# Patient Record
Sex: Male | Born: 1965 | Race: White | Hispanic: No | Marital: Married | Smoking: Former smoker
Health system: Southern US, Community
[De-identification: ages and names within clinical notes are randomized; demographics above are authoritative.]

## PROBLEM LIST (undated history)

## (undated) DIAGNOSIS — E119 Type 2 diabetes mellitus without complications: Secondary | ICD-10-CM

## (undated) DIAGNOSIS — O2230 Deep phlebothrombosis in pregnancy, unspecified trimester: Secondary | ICD-10-CM

## (undated) DIAGNOSIS — I10 Essential (primary) hypertension: Secondary | ICD-10-CM

## (undated) DIAGNOSIS — M549 Dorsalgia, unspecified: Secondary | ICD-10-CM

## (undated) DIAGNOSIS — E7800 Pure hypercholesterolemia, unspecified: Secondary | ICD-10-CM

## (undated) DIAGNOSIS — N289 Disorder of kidney and ureter, unspecified: Secondary | ICD-10-CM

## (undated) HISTORY — PX: EYE SURGERY: SHX253

## (undated) HISTORY — PX: BACK SURGERY: SHX140

## (undated) HISTORY — PX: KNEE ARTHROSCOPY: SHX127

## (undated) HISTORY — PX: ANAL FISSURECTOMY: SUR608

## (undated) HISTORY — PX: SPINAL CORD STIMULATOR IMPLANT: SHX2422

---

## 2002-06-05 ENCOUNTER — Ambulatory Visit (HOSPITAL_COMMUNITY): Admission: RE | Admit: 2002-06-05 | Discharge: 2002-06-05 | Payer: Self-pay | Admitting: Gastroenterology

## 2005-03-11 ENCOUNTER — Ambulatory Visit (HOSPITAL_COMMUNITY): Admission: RE | Admit: 2005-03-11 | Discharge: 2005-03-11 | Payer: Self-pay | Admitting: Neurological Surgery

## 2019-01-17 ENCOUNTER — Other Ambulatory Visit: Payer: Self-pay | Admitting: Nephrology

## 2019-01-17 DIAGNOSIS — N183 Chronic kidney disease, stage 3 unspecified: Secondary | ICD-10-CM

## 2019-01-25 ENCOUNTER — Other Ambulatory Visit: Payer: Self-pay

## 2019-01-25 ENCOUNTER — Ambulatory Visit
Admission: RE | Admit: 2019-01-25 | Discharge: 2019-01-25 | Disposition: A | Discharge status: Home / Self Care | Payer: 59 | Source: Ambulatory Visit | Attending: Nephrology | Admitting: Nephrology

## 2019-01-25 DIAGNOSIS — N183 Chronic kidney disease, stage 3 unspecified: Secondary | ICD-10-CM

## 2019-02-11 ENCOUNTER — Other Ambulatory Visit: Payer: Self-pay | Admitting: Nephrology

## 2019-02-11 DIAGNOSIS — N2889 Other specified disorders of kidney and ureter: Secondary | ICD-10-CM

## 2019-02-11 DIAGNOSIS — N183 Chronic kidney disease, stage 3 unspecified: Secondary | ICD-10-CM

## 2019-02-18 ENCOUNTER — Other Ambulatory Visit: Payer: 59

## 2019-03-05 ENCOUNTER — Ambulatory Visit
Admission: RE | Admit: 2019-03-05 | Discharge: 2019-03-05 | Disposition: A | Discharge status: Home / Self Care | Payer: 59 | Source: Ambulatory Visit | Attending: Nephrology | Admitting: Nephrology

## 2019-03-05 DIAGNOSIS — N2889 Other specified disorders of kidney and ureter: Secondary | ICD-10-CM

## 2019-03-05 MED ORDER — IOPAMIDOL (ISOVUE-300) INJECTION 61%
100.0000 mL | Freq: Once | INTRAVENOUS | Status: AC | PRN
  Administered 2019-03-05: 100 mL via INTRAVENOUS

## 2019-10-09 ENCOUNTER — Encounter (HOSPITAL_BASED_OUTPATIENT_CLINIC_OR_DEPARTMENT_OTHER): Payer: Self-pay | Admitting: Emergency Medicine

## 2019-10-09 ENCOUNTER — Emergency Department (HOSPITAL_BASED_OUTPATIENT_CLINIC_OR_DEPARTMENT_OTHER): Payer: 59

## 2019-10-09 ENCOUNTER — Emergency Department (HOSPITAL_BASED_OUTPATIENT_CLINIC_OR_DEPARTMENT_OTHER)
Admission: EM | Admit: 2019-10-09 | Discharge: 2019-10-09 | Disposition: A | Discharge status: Home / Self Care | Payer: 59 | Attending: Emergency Medicine | Admitting: Emergency Medicine

## 2019-10-09 ENCOUNTER — Other Ambulatory Visit: Payer: Self-pay

## 2019-10-09 DIAGNOSIS — I10 Essential (primary) hypertension: Secondary | ICD-10-CM | POA: Diagnosis not present

## 2019-10-09 DIAGNOSIS — L03317 Cellulitis of buttock: Secondary | ICD-10-CM | POA: Diagnosis not present

## 2019-10-09 DIAGNOSIS — Z79899 Other long term (current) drug therapy: Secondary | ICD-10-CM | POA: Diagnosis not present

## 2019-10-09 DIAGNOSIS — E119 Type 2 diabetes mellitus without complications: Secondary | ICD-10-CM | POA: Insufficient documentation

## 2019-10-09 DIAGNOSIS — Z87891 Personal history of nicotine dependence: Secondary | ICD-10-CM | POA: Insufficient documentation

## 2019-10-09 DIAGNOSIS — R739 Hyperglycemia, unspecified: Secondary | ICD-10-CM

## 2019-10-09 DIAGNOSIS — K6289 Other specified diseases of anus and rectum: Secondary | ICD-10-CM | POA: Diagnosis present

## 2019-10-09 HISTORY — DX: Pure hypercholesterolemia, unspecified: E78.00

## 2019-10-09 HISTORY — DX: Essential (primary) hypertension: I10

## 2019-10-09 HISTORY — DX: Type 2 diabetes mellitus without complications: E11.9

## 2019-10-09 HISTORY — DX: Disorder of kidney and ureter, unspecified: N28.9

## 2019-10-09 HISTORY — DX: Dorsalgia, unspecified: M54.9

## 2019-10-09 HISTORY — DX: Deep phlebothrombosis in pregnancy, unspecified trimester: O22.30

## 2019-10-09 LAB — CBC WITH DIFFERENTIAL/PLATELET
Abs Immature Granulocytes: 0.06 10*3/uL (ref 0.00–0.07)
Basophils Absolute: 0 10*3/uL (ref 0.0–0.1)
Basophils Relative: 0 %
Eosinophils Absolute: 0.2 10*3/uL (ref 0.0–0.5)
Eosinophils Relative: 2 %
HCT: 39.8 % (ref 39.0–52.0)
Hemoglobin: 13.8 g/dL (ref 13.0–17.0)
Immature Granulocytes: 1 %
Lymphocytes Relative: 10 %
Lymphs Abs: 1 10*3/uL (ref 0.7–4.0)
MCH: 31.2 pg (ref 26.0–34.0)
MCHC: 34.7 g/dL (ref 30.0–36.0)
MCV: 90 fL (ref 80.0–100.0)
Monocytes Absolute: 0.7 10*3/uL (ref 0.1–1.0)
Monocytes Relative: 7 %
Neutro Abs: 7.5 10*3/uL (ref 1.7–7.7)
Neutrophils Relative %: 80 %
Platelets: 142 10*3/uL — ABNORMAL LOW (ref 150–400)
RBC: 4.42 MIL/uL (ref 4.22–5.81)
RDW: 12.1 % (ref 11.5–15.5)
WBC: 9.4 10*3/uL (ref 4.0–10.5)
nRBC: 0 % (ref 0.0–0.2)

## 2019-10-09 LAB — COMPREHENSIVE METABOLIC PANEL
ALT: 34 U/L (ref 0–44)
AST: 20 U/L (ref 15–41)
Albumin: 3.7 g/dL (ref 3.5–5.0)
Alkaline Phosphatase: 106 U/L (ref 38–126)
Anion gap: 11 (ref 5–15)
BUN: 24 mg/dL — ABNORMAL HIGH (ref 6–20)
CO2: 25 mmol/L (ref 22–32)
Calcium: 8.7 mg/dL — ABNORMAL LOW (ref 8.9–10.3)
Chloride: 94 mmol/L — ABNORMAL LOW (ref 98–111)
Creatinine, Ser: 1.6 mg/dL — ABNORMAL HIGH (ref 0.61–1.24)
GFR calc Af Amer: 56 mL/min — ABNORMAL LOW (ref >60)
GFR calc non Af Amer: 48 mL/min — ABNORMAL LOW (ref >60)
Glucose, Bld: 487 mg/dL — ABNORMAL HIGH (ref 70–99)
Potassium: 4.1 mmol/L (ref 3.5–5.1)
Sodium: 130 mmol/L — ABNORMAL LOW (ref 135–145)
Total Bilirubin: 0.9 mg/dL (ref 0.3–1.2)
Total Protein: 7.2 g/dL (ref 6.5–8.1)

## 2019-10-09 LAB — CBG MONITORING, ED: Glucose-Capillary: 387 mg/dL — ABNORMAL HIGH (ref 70–99)

## 2019-10-09 LAB — LACTIC ACID, PLASMA: Lactic Acid, Venous: 1.3 mmol/L (ref 0.5–1.9)

## 2019-10-09 MED ORDER — MORPHINE SULFATE (PF) 4 MG/ML IV SOLN
4.0000 mg | Freq: Once | INTRAVENOUS | Status: AC
  Administered 2019-10-09: 11:00:00 4 mg via INTRAVENOUS
  Filled 2019-10-09: qty 1

## 2019-10-09 MED ORDER — IOHEXOL 300 MG/ML  SOLN
100.0000 mL | Freq: Once | INTRAMUSCULAR | Status: AC | PRN
  Administered 2019-10-09: 11:00:00 100 mL via INTRAVENOUS

## 2019-10-09 MED ORDER — ACETAMINOPHEN 325 MG PO TABS
650.0000 mg | ORAL_TABLET | Freq: Once | ORAL | Status: AC
  Administered 2019-10-09: 11:00:00 650 mg via ORAL
  Filled 2019-10-09: qty 2

## 2019-10-09 MED ORDER — METFORMIN HCL 500 MG PO TABS
ORAL_TABLET | ORAL | Status: AC
  Filled 2019-10-09: qty 1

## 2019-10-09 MED ORDER — METFORMIN HCL 850 MG PO TABS
850.0000 mg | ORAL_TABLET | Freq: Once | ORAL | Status: DC
  Filled 2019-10-09: qty 1

## 2019-10-09 MED ORDER — INSULIN ASPART 100 UNIT/ML ~~LOC~~ SOLN
5.0000 [IU] | Freq: Once | SUBCUTANEOUS | Status: AC
  Administered 2019-10-09: 12:00:00 5 [IU] via SUBCUTANEOUS
  Filled 2019-10-09: qty 1

## 2019-10-09 MED ORDER — AMOXICILLIN-POT CLAVULANATE 875-125 MG PO TABS: 1.0000 | ORAL_TABLET | Freq: Two times a day (BID) | ORAL | 0 refills | Status: DC

## 2019-10-09 MED ORDER — PIPERACILLIN-TAZOBACTAM 3.375 G IVPB 30 MIN
3.3750 g | Freq: Once | INTRAVENOUS | Status: AC
  Administered 2019-10-09: 11:00:00 3.375 g via INTRAVENOUS
  Filled 2019-10-09 (×2): qty 50

## 2019-10-09 MED ORDER — SODIUM CHLORIDE 0.9 % IV SOLN: INTRAVENOUS | Status: DC | PRN

## 2019-10-09 MED ORDER — METFORMIN HCL 500 MG PO TABS: 500.0000 mg | ORAL_TABLET | Freq: Once | ORAL | Status: DC

## 2019-10-09 MED ORDER — METFORMIN HCL 850 MG PO TABS: 850.0000 mg | ORAL_TABLET | Freq: Two times a day (BID) | ORAL | 0 refills | Status: DC

## 2019-10-09 MED ORDER — SODIUM CHLORIDE 0.9 % IV BOLUS
1000.0000 mL | Freq: Once | INTRAVENOUS | Status: AC
  Administered 2019-10-09: 11:00:00 1000 mL via INTRAVENOUS

## 2019-10-09 MED FILL — MetFORMIN HCL 850 MG TAB: 850 | 30 days supply | Qty: 60 | Fill #0

## 2019-10-09 MED FILL — AMOX-CLAV 875-125 MG TABLET: 875-125 | 7 days supply | Qty: 14 | Fill #0

## 2019-10-09 NOTE — ED Notes (Signed)
ED Provider at bedside. 

## 2019-10-09 NOTE — Discharge Instructions (Signed)
Take augmentin twice daily for a week.   Take metformin twice daily as prescribed.   You likely have diabetes. You need to see your doctor and likely will need to be restarted on insulin   Take your other meds as prescribed   Return to ER if you have fever, worse pain, vomiting.

## 2019-10-09 NOTE — ED Notes (Signed)
Patient transported to CT 

## 2019-10-09 NOTE — ED Triage Notes (Signed)
Hard, swollen area between testicles and anus that started 3 days ago.  Pt sts it has "doubled in size since yesterday."

## 2019-10-09 NOTE — ED Provider Notes (Signed)
[facility] EMERGENCY DEPARTMENT Provider Note   CSN: [id number] Arrival date & time: 10/09/19  3151     History Chief Complaint  Patient presents with  . Abscess    Donald Lin is a 54 y.o. male hx of HTN, previous anal fissure, here presenting with rectal pain .  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  Patient states that he noticed rectal pain for the last 3 to 4 days .  Patient states that the area became more more swollen and he has significant pain there.  He tried some stool softeners with no relief .  Patient denies any anal sex .  Patient is only sexually active with his wife.  Patient denies any previous history of MRSA infections.   The history is provided by the patient.       Past Medical History:  Diagnosis Date  . Back pain   . Diabetes mellitus without complication (HCC)   . DVT (deep vein thrombosis) in pregnancy   . High cholesterol   . Hypertension   . Renal disorder     There are no problems to display for this patient.   Past Surgical History:  Procedure Laterality Date  . ANAL FISSURECTOMY    . BACK SURGERY    . EYE SURGERY    . KNEE ARTHROSCOPY    . SPINAL CORD STIMULATOR IMPLANT         No family history on file.  Social History   Tobacco Use  . Smoking status: Former Games developer  . Smokeless tobacco: Never Used  Substance Use Topics  . Alcohol use: Yes    Comment: rarely  . Drug use: Never    Home Medications Prior to Admission medications   Medication Sig Start Date End Date Taking? Authorizing Provider  methadone (DOLOPHINE) 10 MG tablet Take 10 mg by mouth 3 (three) times daily. 10/07/19   [provider]  Oxycodone HCl 10 MG TABS Take 10 mg by mouth 4 (four) times daily. 10/07/19   [provider]    Allergies    Patient has no known allergies.  Review of Systems   Review of Systems  Gastrointestinal: Positive for rectal pain.  All other systems reviewed and are  negative.   Physical Exam Updated Vital Signs BP (!) 141/83 (BP Location: Left Arm)   Pulse 88   Temp 98.9 F (37.2 C) (Oral)   Resp 16   Ht 6\' 5"  (1.956 m)   Wt (!) 147 kg   SpO2 95%   BMI 38.42 kg/m   Physical Exam Vitals and nursing note reviewed.  HENT:     Head: Normocephalic.     Nose: Nose normal.     Mouth/Throat:     Mouth: Mucous membranes are moist.  Eyes:     Pupils: Pupils are equal, round, and reactive to light.  Cardiovascular:     Rate and Rhythm: Normal rate.     Pulses: Normal pulses.  Pulmonary:     Effort: Pulmonary effort is normal.  Abdominal:     General: Abdomen is flat.  Genitourinary:    Comments: Rectal- ? Condylomas vs skin tags. There is diffuse swelling L perirectal area. ? Cellulitis vs abscess No scrotal swelling or tenderness. No obvious anal fissures  Musculoskeletal:        General: Normal range of motion.     Cervical back: Normal range of motion.  Skin:    General: Skin is warm.  Capillary Refill: Capillary refill takes less than 2 seconds.  Neurological:     General: No focal deficit present.     Mental Status: He is alert.  Psychiatric:        Mood and Affect: Mood normal.     ED Results / Procedures / Treatments   Labs (all labs ordered are listed, but only abnormal results are displayed) Labs Reviewed  CBC WITH DIFFERENTIAL/PLATELET - Abnormal; Notable for the following components:      Result Value   Platelets 142 (*)    All other components within normal limits  COMPREHENSIVE METABOLIC PANEL - Abnormal; Notable for the following components:   Sodium 130 (*)    Chloride 94 (*)    Glucose, Bld 487 (*)    BUN 24 (*)    Creatinine, Ser 1.60 (*)    Calcium 8.7 (*)    GFR calc non Af Amer 48 (*)    GFR calc Af Amer 56 (*)    All other components within normal limits  CBG MONITORING, ED - Abnormal; Notable for the following components:   Glucose-Capillary 387 (*)    All other components within normal limits    CULTURE, BLOOD (ROUTINE X 2)  CULTURE, BLOOD (ROUTINE X 2)  LACTIC ACID, PLASMA  LACTIC ACID, PLASMA    EKG None  Radiology CT PELVIS W CONTRAST  Result Date: 10/09/2019 CLINICAL DATA:  Lymphadenopathy, inguinal/pelvic rectal abscess versus cellulitis. Scrotal boil. EXAM: CT PELVIS WITH CONTRAST TECHNIQUE: Multidetector CT imaging of the pelvis was performed using the standard protocol following the bolus administration of intravenous contrast. CONTRAST:  OMNIPAQUE IOHEXOL 300 MG/ML  SOLN COMPARISON:  CT abdomen 03/05/2019. FINDINGS: Urinary Tract: Distal ureters are decompressed. Bladder is grossly unremarkable. Bowel: Visualized portions of the small bowel and colon are unremarkable. Vascular/Lymphatic: Vascular structures are unremarkable. There are small lymph nodes along the iliac chains bilaterally, measuring up to 10 mm along the external iliac chains. Inguinal lymph nodes measure up to 10 mm on the right. Reproductive:  Prostate is visualized. Other: There is scrotal thickening bilaterally. The right testicle appears slightly higher than the left. No discrete fluid collection. Phlegmon and stranding extend along the perineum, extending posteriorly to the gluteal folds. There is asymmetric subcutaneous soft tissue thickening along the medial aspect of the left gluteal fold, again without a discrete fluid collection. Bilateral inguinal hernias contain fat. No peritoneal free fluid. Musculoskeletal: Degenerative and postoperative changes in the spine. Sclerotic lesion in the proximal right femur may represent an enchondroma or old bone infarct. There is evidence of avascular necrosis in the left femoral head. Otherwise, no worrisome lytic or sclerotic lesions. IMPRESSION: 1. Cellulitis involving the scrotum, perineum and gluteal folds. No definite organized fluid collection to suggest abscess. Note is made that CT is rather nonspecific in the evaluation of the testicles and scrotal sac.  Further evaluation with testicular ultrasound may be helpful in further evaluation as clinically indicated. 2. Small lymph nodes along the iliac chains and inguinal regions, likely reactive. 3. Bilateral inguinal hernias contain fat. 4. 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 Electronically Signed   By: Leanna Battles M.D.   On: 10/09/2019 12:07    Procedures Procedures (including critical care time)  Medications Ordered in ED Medications  0.9 %  sodium chloride infusion ( Intravenous New Bag/Given 10/09/19 1057)  sodium chloride 0.9 % bolus 1,000 mL (1,000 mLs Intravenous New Bag/Given 10/09/19 1036)  morphine 4 MG/ML injection 4 mg (4 mg Intravenous  Given 10/09/19 1054)  piperacillin-tazobactam (ZOSYN) IVPB 3.375 g (3.375 g Intravenous New Bag/Given 10/09/19 1059)  acetaminophen (TYLENOL) tablet 650 mg (650 mg Oral Given 10/09/19 1052)  iohexol (OMNIPAQUE) 300 MG/ML solution 100 mL (100 mLs Intravenous Contrast Given 10/09/19 1117)  insulin aspart (novoLOG) injection 5 Units (5 Units Subcutaneous Given 10/09/19 1137)    ED Course  I have reviewed the triage vital signs and the nursing notes.  Pertinent labs & imaging results that were available during my care of the patient were reviewed by me and considered in my medical decision making (see chart for details).    MDM Rules/Calculators/A&P                      Donald Lin is a 54 y.o. male here with left buttock swelling.  Patient has low-grade temp and is tachycardic .  I am concerned for possible fistula versus abscess versus cellulitis.  Will get sepsis work-up and perform CT pelvis for further evaluation .  Will give antibiotics empirically.  12:34 PM Patient's glucose is 487.  His anion gap is normal.  His sodium is slightly low likely from hyperglycemia.  His CT shows cellulitis with no obvious abscess .  I discussed with patient regarding diagnosis of diabetes .  He states that he was actually on insulin and Metformin previously.  However he changed primary care doctors and he is out of those medicines for a while.  I told him that I can prescribe Metformin and Augmentin for now.  He needs to see his primary care doctor and likely will need to be restarted on insulin.      Final Clinical Impression(s) / ED Diagnoses Final diagnoses:  None    Rx / DC Orders ED Discharge Orders    None       Drenda Freeze, MD 10/09/19 1235

## 2019-10-14 LAB — CULTURE, BLOOD (ROUTINE X 2)
Culture: NO GROWTH
Culture: NO GROWTH
Special Requests: ADEQUATE
Special Requests: ADEQUATE

## 2020-09-28 IMAGING — CT CT PELVIS W/ CM
2 of 4 series · 15 of 46 positions shown, 18 images · IV contrast (Omnipaque)
Comparison: CT abdomen 03/05/2019.

CLINICAL DATA: Lymphadenopathy, inguinal/pelvic rectal abscess
versus cellulitis. Scrotal boil.

EXAM:
CT PELVIS WITH CONTRAST
TECHNIQUE: Multidetector CT imaging of the pelvis was performed using the
standard protocol following the bolus administration of intravenous
contrast.
CONTRAST:  100mL OMNIPAQUE IOHEXOL 300 MG/ML  SOLN

[Series 3: axial soft tissue · axial · 0.93mm/px · z∈[-547,-171]mm · 12 of 209 slices shown, 15 images]
[im 14/209  soft-tissue]
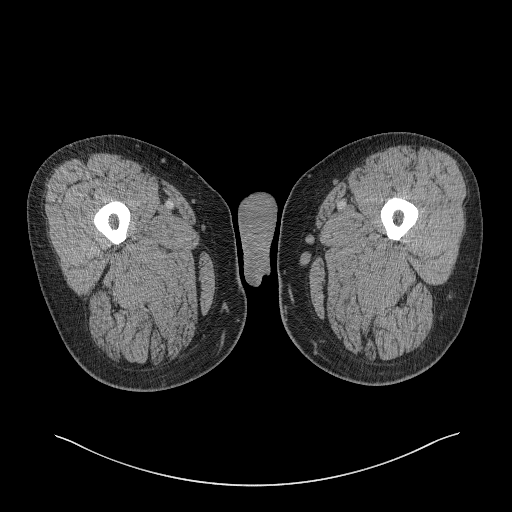
[im 14/209  bone]
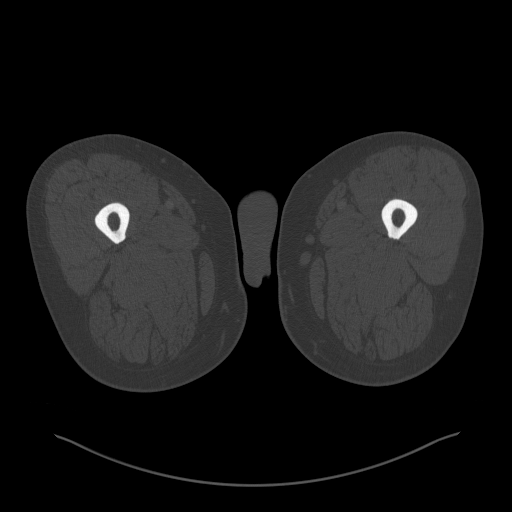
[im 41/209  soft-tissue]
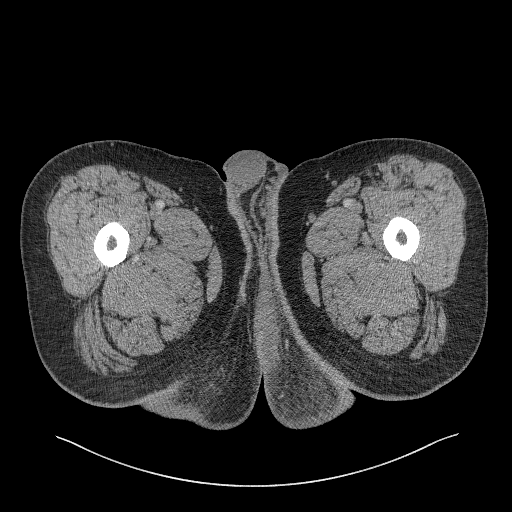
[im 61/209  soft-tissue]
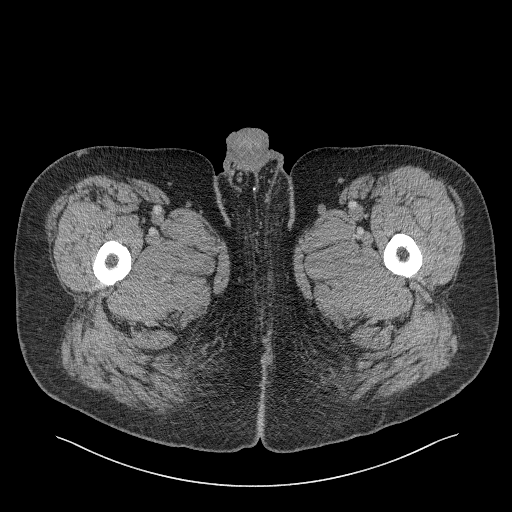
[im 81/209  soft-tissue]
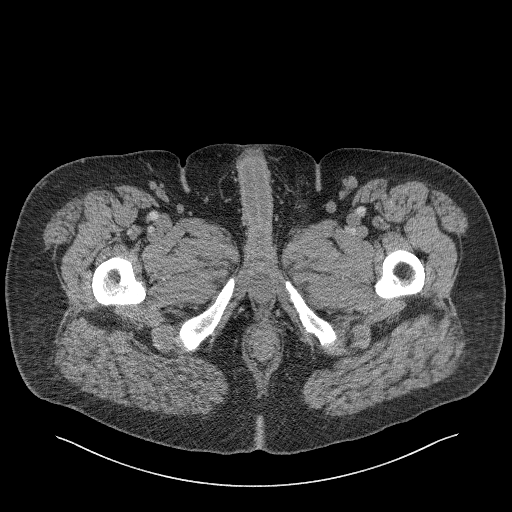
[im 108/209  soft-tissue]
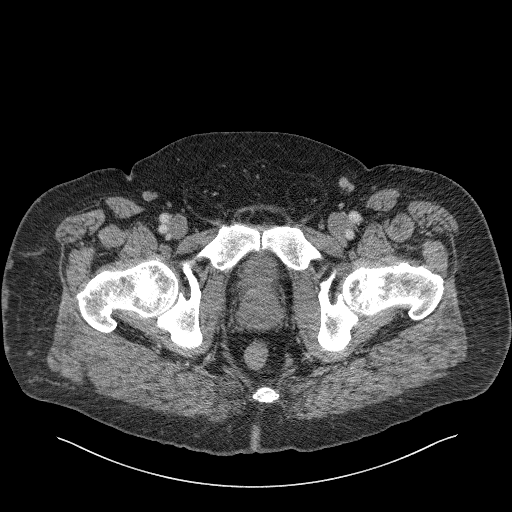
[im 128/209  soft-tissue]
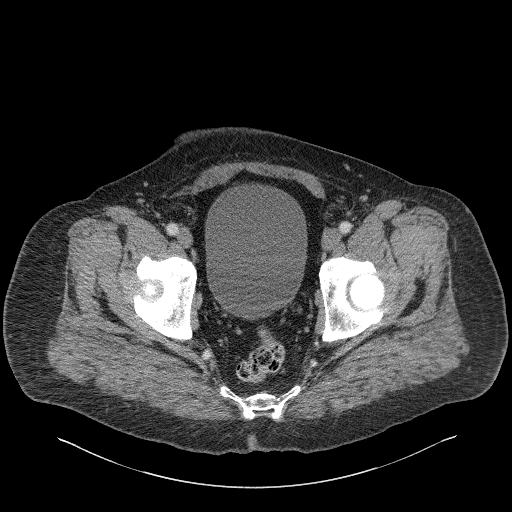
[im 148/209  soft-tissue]
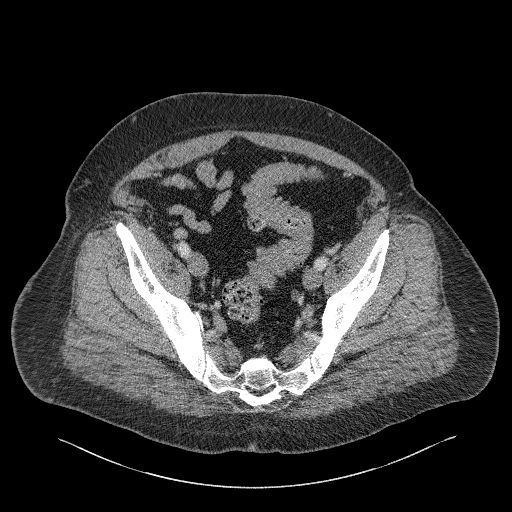
[im 175/209  soft-tissue]
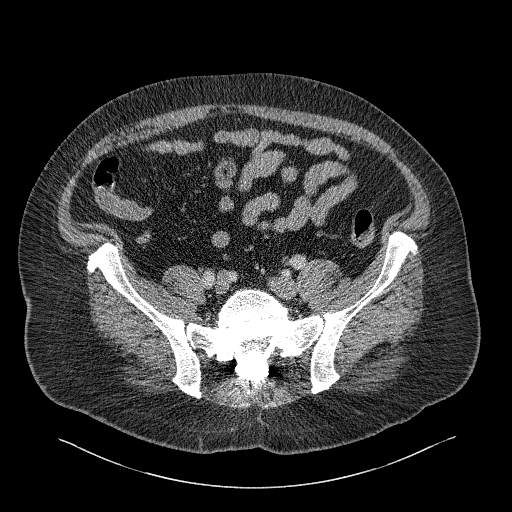
[im 182/209  lung]
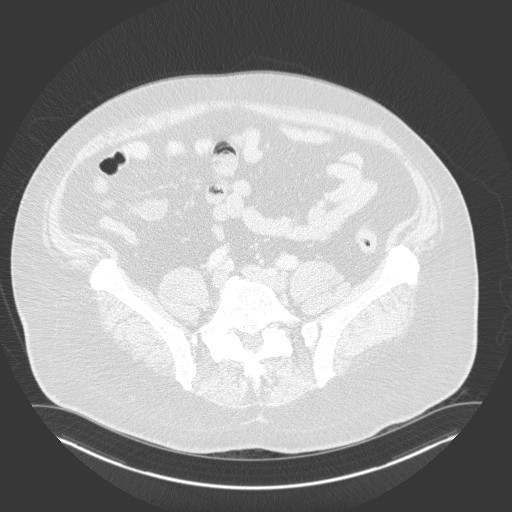
[im 188/209  lung]
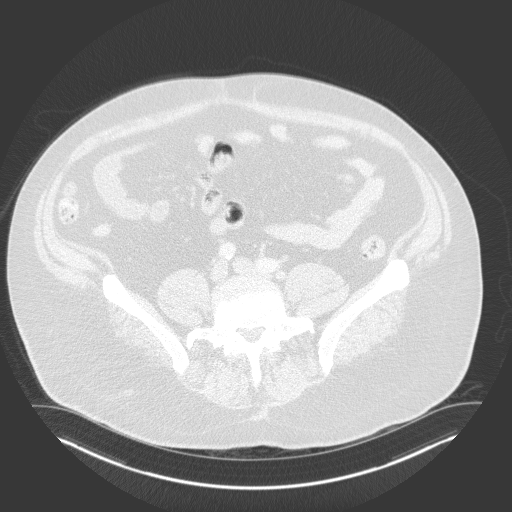
[im 195/209  soft-tissue]
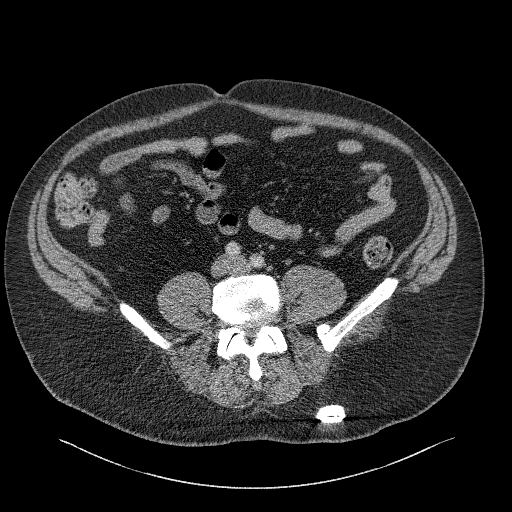
[im 195/209  lung]
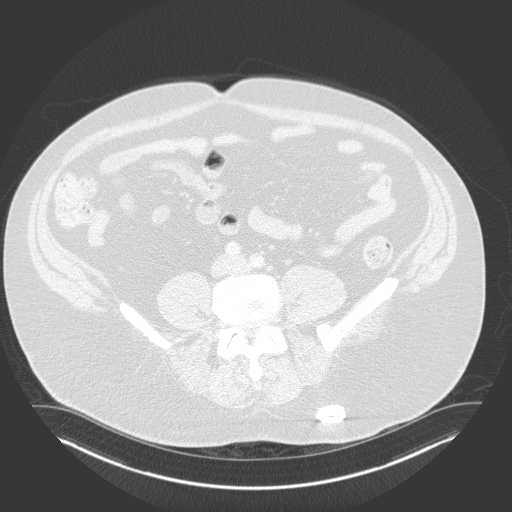
[im 195/209  bone]
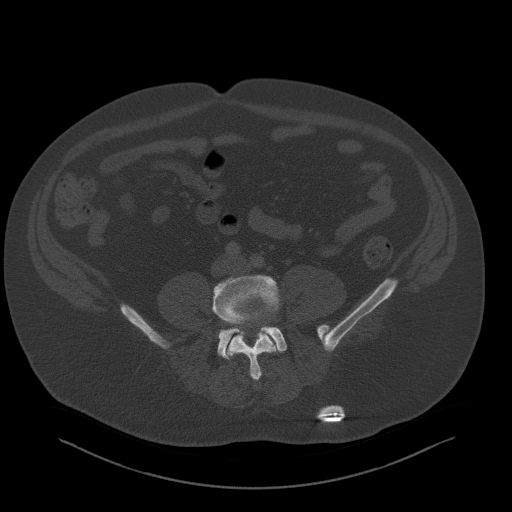
[im 202/209  lung]
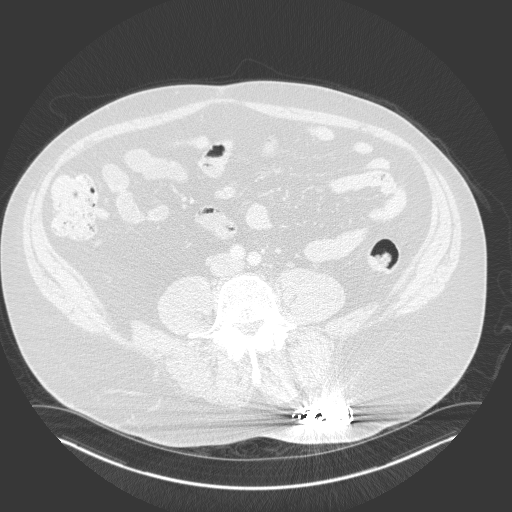

[Series 6: coronal st · coronal · 0.95mm/px · 3 of 170 slices shown]
[im 57/170  soft-tissue]
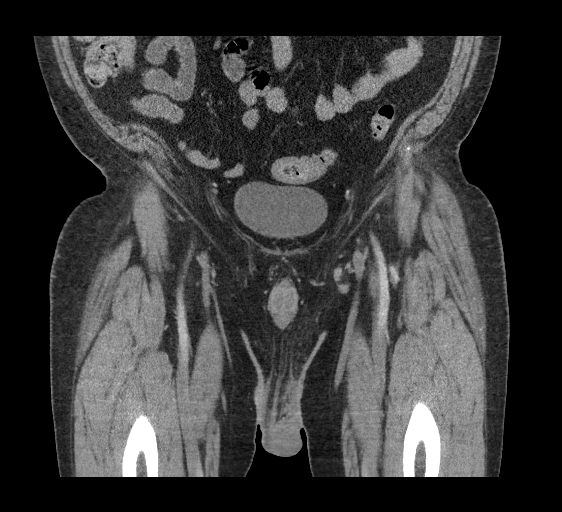
[im 76/170  soft-tissue]
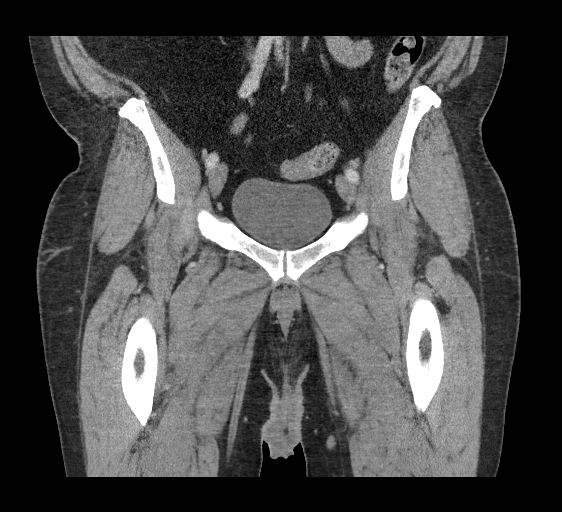
[im 94/170  soft-tissue]
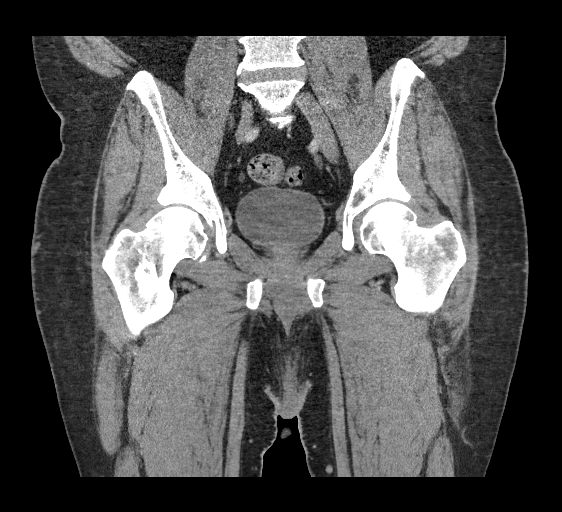

[15 of 46 positions shown; findings below may reference images not displayed]

FINDINGS: Urinary Tract: Distal ureters are decompressed. Bladder is grossly
unremarkable.

Bowel: Visualized portions of the small bowel and colon are
unremarkable.

Vascular/Lymphatic: Vascular structures are unremarkable. There are
small lymph nodes along the iliac chains bilaterally, measuring up
to 10 mm along the external iliac chains. Inguinal lymph nodes
measure up to 10 mm on the right.

Reproductive:  Prostate is visualized.

Other: There is scrotal thickening bilaterally. The right testicle
appears slightly higher than the left. No discrete fluid collection.
Phlegmon and stranding extend along the perineum, extending
posteriorly to the gluteal folds. There is asymmetric subcutaneous
soft tissue thickening along the medial aspect of the left gluteal
fold, again without a discrete fluid collection. Bilateral inguinal
hernias contain fat. No peritoneal free fluid.

Musculoskeletal: Degenerative and postoperative changes in the
spine. Sclerotic lesion in the proximal right femur may represent an
enchondroma or old bone infarct. There is evidence of avascular
necrosis in the left femoral head. Otherwise, no worrisome lytic or
sclerotic lesions.
IMPRESSION: 1. Cellulitis involving the scrotum, perineum and gluteal folds. No
definite organized fluid collection to suggest abscess. Note is made
that CT is rather nonspecific in the evaluation of the testicles and
scrotal sac. Further evaluation with testicular ultrasound may be
helpful in further evaluation as clinically indicated.
2. Small lymph nodes along the iliac chains and inguinal regions,
likely reactive.
3. Bilateral inguinal hernias contain fat.
4. Avascular necrosis in the left femoral head.

## 2024-02-27 ENCOUNTER — Ambulatory Visit: Admission: RE | Admit: 2024-02-27 | Discharge: 2024-02-27 | Disposition: A | Discharge status: Home / Self Care

## 2024-02-27 DIAGNOSIS — R1011 Right upper quadrant pain: Secondary | ICD-10-CM

## 2024-02-27 DIAGNOSIS — I169 Hypertensive crisis, unspecified: Secondary | ICD-10-CM

## 2024-02-27 NOTE — Discharge Instructions (Addendum)
 Advised patient to go to [facility] now for further evaluation of right upper quadrant abdominal pain and extremely high blood pressure.

## 2024-02-27 NOTE — ED Notes (Signed)
High BP reported to provider.

## 2024-02-27 NOTE — ED Provider Notes (Signed)
 [name]    CSN: [id number] Arrival date & time: 02/27/24  0857      History   Chief Complaint Chief Complaint  Patient presents with   Back Pain    HPI Donald Lin is a 58 y.o. male.   HPI 58 year old male presents with chest/upper abdominal pain that radiates to the right side of his back for 1 month. PMH significant for severe/morbid obesity, back pain, renal disorder and HTN.  First BP 207/127, 196/128-second in triage.  Past Medical History:  Diagnosis Date   Back pain    Diabetes mellitus without complication (HCC)    DVT (deep vein thrombosis) in pregnancy    High cholesterol    Hypertension    Renal disorder     There are no active problems to display for this patient.   Past Surgical History:  Procedure Laterality Date   ANAL FISSURECTOMY     BACK SURGERY     EYE SURGERY     KNEE ARTHROSCOPY     SPINAL CORD STIMULATOR IMPLANT         Home Medications    Prior to Admission medications   Medication Sig Start Date End Date Taking? Authorizing Provider  methadone (DOLOPHINE) 10 MG tablet Take 10 mg by mouth 3 (three) times daily. 10/07/19   [provider]  Oxycodone HCl 10 MG TABS Take 10 mg by mouth 4 (four) times daily. 10/07/19   [provider]    Family History History reviewed. No pertinent family history.  Social History Social History   Tobacco Use   Smoking status: Former   Smokeless tobacco: Never  Substance Use Topics   Alcohol use: Yes    Comment: rarely   Drug use: Never     Allergies   Patient has no known allergies.   Review of Systems Review of Systems  Gastrointestinal:  Positive for abdominal pain.  Musculoskeletal:  Positive for back pain.  All other systems reviewed and are negative.    Physical Exam Triage Vital Signs ED Triage Vitals  Encounter Vitals Group     BP      Systolic BP Percentile      Diastolic BP Percentile      Pulse      Resp      Temp      Temp src       SpO2      Weight      Height      Head Circumference      Peak Flow      Pain Score      Pain Loc      Pain Education      Exclude from Growth Chart    No data found.  Updated Vital Signs BP (!) 196/128   Pulse 93   Temp 98.9 F (37.2 C) (Oral)   Resp 17   SpO2 97%    Physical Exam Vitals and nursing note reviewed.  Constitutional:      Appearance: Normal appearance. He is obese.  HENT:     Head: Normocephalic and atraumatic.     Mouth/Throat:     Mouth: Mucous membranes are moist.     Pharynx: Oropharynx is clear.  Eyes:     Extraocular Movements: Extraocular movements intact.     Conjunctiva/sclera: Conjunctivae normal.     Pupils: Pupils are equal, round, and reactive to light.  Cardiovascular:     Rate and Rhythm: Normal rate and regular rhythm.  Pulses: Normal pulses.     Heart sounds: Normal heart sounds.  Pulmonary:     Effort: Pulmonary effort is normal.     Breath sounds: Normal breath sounds. No wheezing, rhonchi or rales.  Abdominal:     General: Bowel sounds are absent. There is distension. There is no abdominal bruit.     Palpations: Abdomen is soft. There is shifting dullness. There is no fluid wave, hepatomegaly, splenomegaly or mass.     Tenderness: There is abdominal tenderness in the right upper quadrant. There is guarding and rebound. There is no right CVA tenderness or left CVA tenderness. Negative signs include Murphy's sign, McBurney's sign and psoas sign.  Musculoskeletal:        General: Normal range of motion.     Cervical back: Normal range of motion and neck supple.  Skin:    General: Skin is warm and dry.  Neurological:     General: No focal deficit present.     Mental Status: He is alert and oriented to person, place, and time. Mental status is at baseline.  Psychiatric:        Mood and Affect: Mood normal.        Behavior: Behavior normal.      UC Treatments / Results  Labs (all labs ordered are listed, but only abnormal  results are displayed) Labs Reviewed - No data to display  EKG   Radiology No results found.  Procedures Procedures (including critical care time)  Medications Ordered in UC Medications - No data to display  Initial Impression / Assessment and Plan / UC Course  I have reviewed the triage vital signs and the nursing notes.  Pertinent labs & imaging results that were available during my care of the patient were reviewed by me and considered in my medical decision making (see chart for details).     MDM: 1.  Abdominal pain, right upper quadrant-concern for acute cholecystitis and/or PE given past medical history.  2.  Hypertensive crisis-second BP in clinic 196/128 advised patient to go to [facility] now for further evaluation of right upper quadrant abdominal pain and extremely high blood pressure.  Patient agreed and verbalized understanding of these instructions and this plan of care this morning.  Patient discharged to ED, hemodynamically stable. Final Clinical Impressions(s) / UC Diagnoses   Final diagnoses:  Hypertensive crisis  Abdominal pain, right upper quadrant     Discharge Instructions      Advised patient to go to [facility] now for further evaluation of right upper quadrant abdominal pain and extremely high blood pressure.   ED Prescriptions   None    PDMP not reviewed this encounter.   Leonides Ramp, FNP 02/27/24 [phone]

## 2024-02-27 NOTE — ED Triage Notes (Signed)
 Pt c/o pain in center of chest that radiates around the right side to his back. Tried OTC Aspercreme, biofreeze, cbd, heat/ice and tylenol  with no relief.

## 2024-02-27 NOTE — ED Notes (Signed)
 Patient is being discharged from the Urgent Care and sent to the Emergency Department via POV. Per Leonides Ramp FNP, patient is in need of higher level of care due to severe flank pain and need for advanced imaging. Patient is aware and verbalizes understanding of plan of care.  Vitals:   02/27/24 0922 02/27/24 0926  BP: (!) 204/127 (!) 196/128  Pulse: 93   Resp: 17   Temp: 98.9 F (37.2 C)   SpO2: 97%

## 2024-11-06 ENCOUNTER — Other Ambulatory Visit
